# Patient Record
Sex: Female | Born: 2000 | Race: White | Hispanic: No | Marital: Single | Smoking: Never smoker
Health system: Southern US, Community
[De-identification: ages and names within clinical notes are randomized; demographics above are authoritative.]

## PROBLEM LIST (undated history)

## (undated) DIAGNOSIS — D649 Anemia, unspecified: Secondary | ICD-10-CM

## (undated) DIAGNOSIS — F419 Anxiety disorder, unspecified: Secondary | ICD-10-CM

## (undated) DIAGNOSIS — F32A Depression, unspecified: Secondary | ICD-10-CM

---
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
## 2021-10-20 IMAGING — CT CT ABD-PELV W/ CM
2 of 4 series · 14 of 46 positions shown, 16 images · IV contrast (APPLIED)
Comparison: Chest radiographs 06/30/2020.

CLINICAL DATA: 19-year-old female with chest pain, abdominal pain,
syncope, nonproductive cough.

EXAM:
CT ANGIOGRAPHY CHEST
CT ABDOMEN AND PELVIS WITH CONTRAST
TECHNIQUE: Multidetector CT imaging of the chest was performed using the
standard protocol during bolus administration of intravenous
contrast. Multiplanar CT image reconstructions and MIPs were
obtained to evaluate the vascular anatomy. Multidetector CT imaging
of the abdomen and pelvis was performed using the standard protocol
during bolus administration of intravenous contrast.
CONTRAST:  100mL OMNIPAQUE IOHEXOL 350 MG/ML SOLN

[Series 2: axial st · axial · 0.75mm/px · z∈[-1166,-721]mm · 11 of 99 slices shown, 13 images]
[im 5/99  soft-tissue]
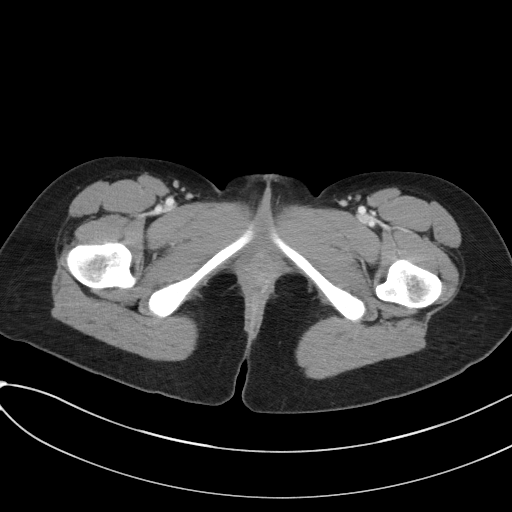
[im 5/99  bone]
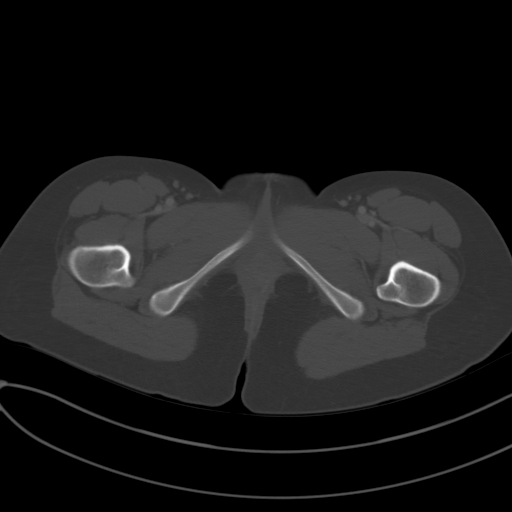
[im 14/99  soft-tissue]
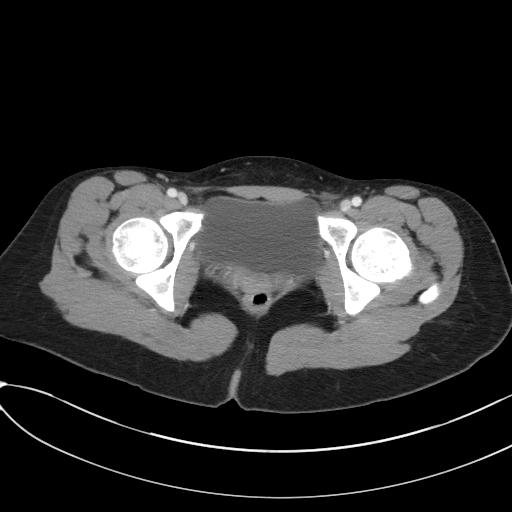
[im 23/99  soft-tissue]
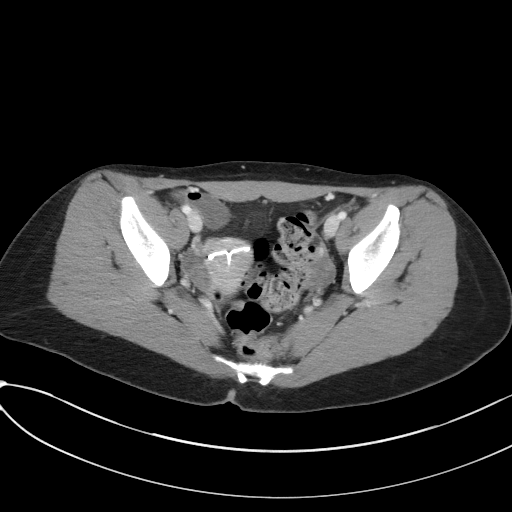
[im 32/99  soft-tissue]
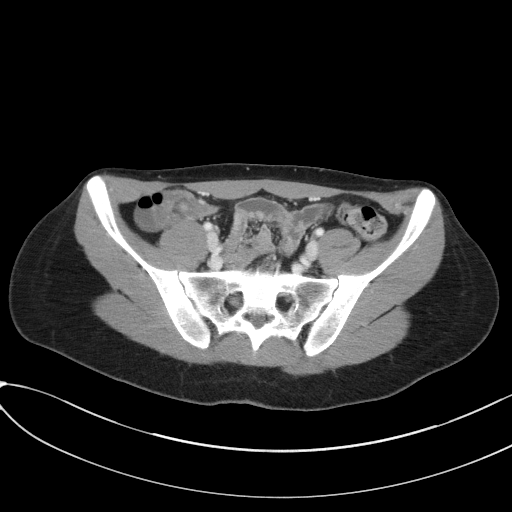
[im 41/99  soft-tissue]
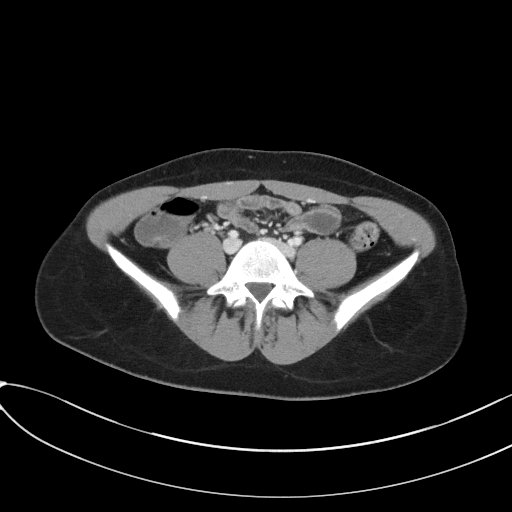
[im 49/99  soft-tissue]
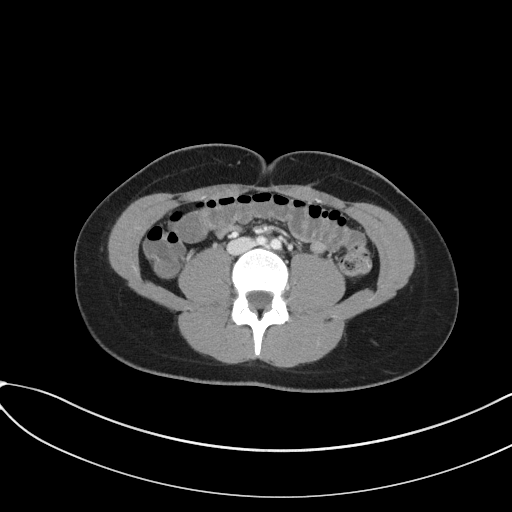
[im 58/99  soft-tissue]
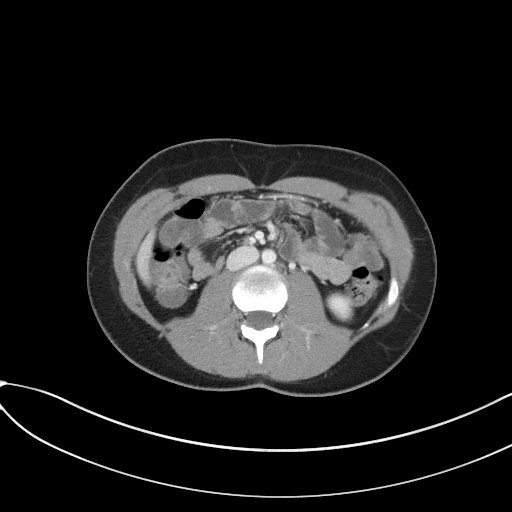
[im 67/99  soft-tissue]
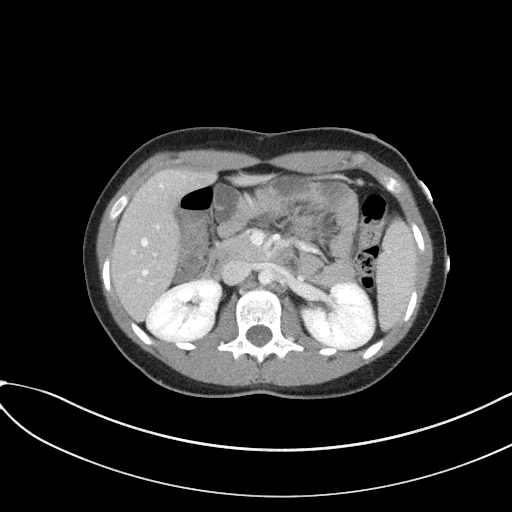
[im 76/99  soft-tissue]
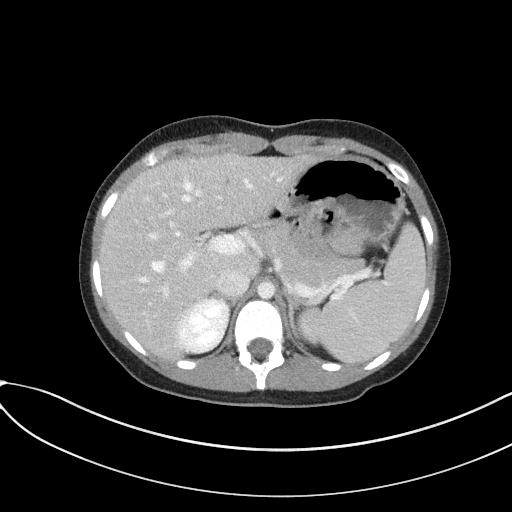
[im 76/99  bone]
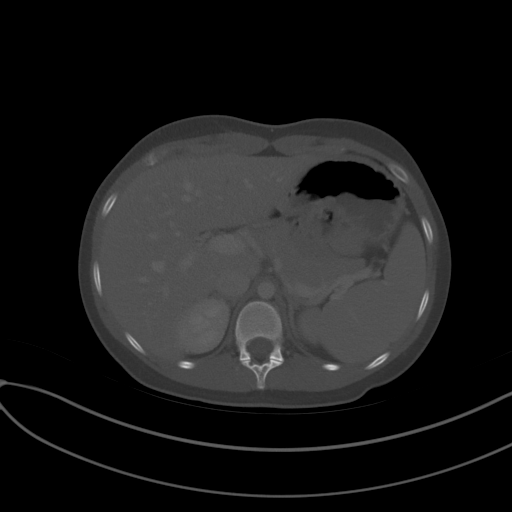
[im 85/99  soft-tissue]
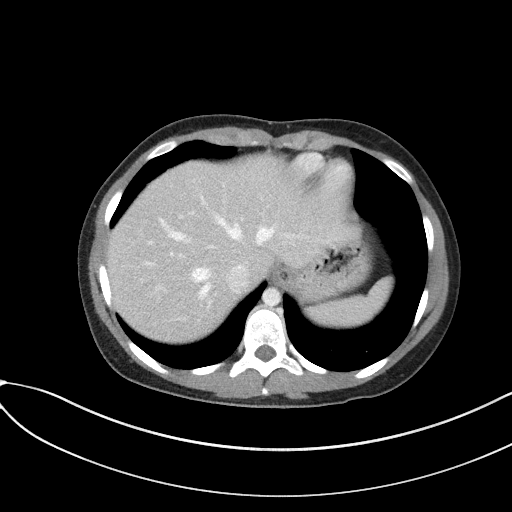
[im 94/99  soft-tissue]
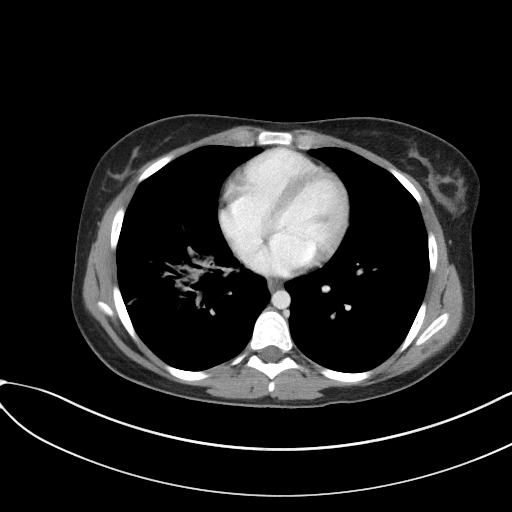

[Series 5: coronal st · coronal · 0.60mm/px · 3 of 76 slices shown]
[im 26/76  soft-tissue]
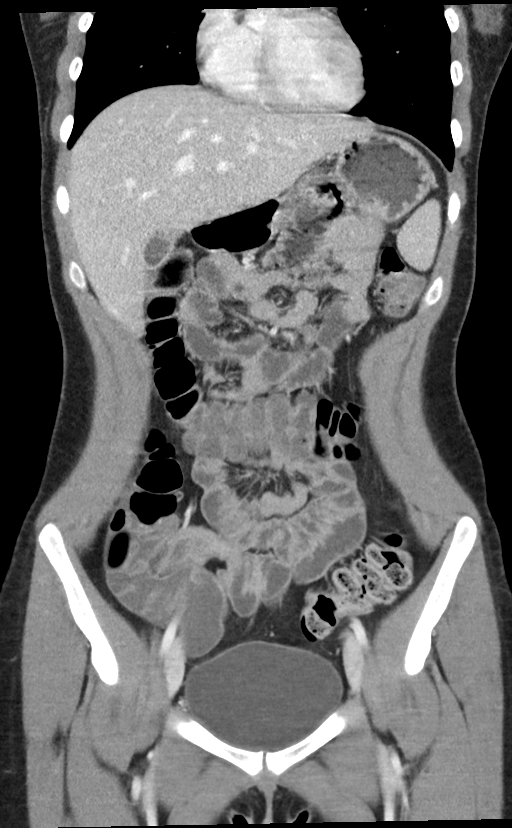
[im 34/76  soft-tissue]
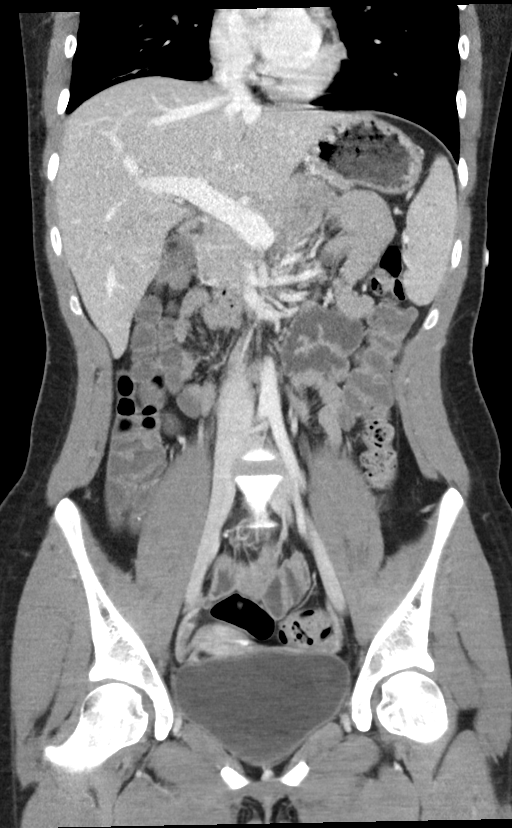
[im 42/76  soft-tissue]
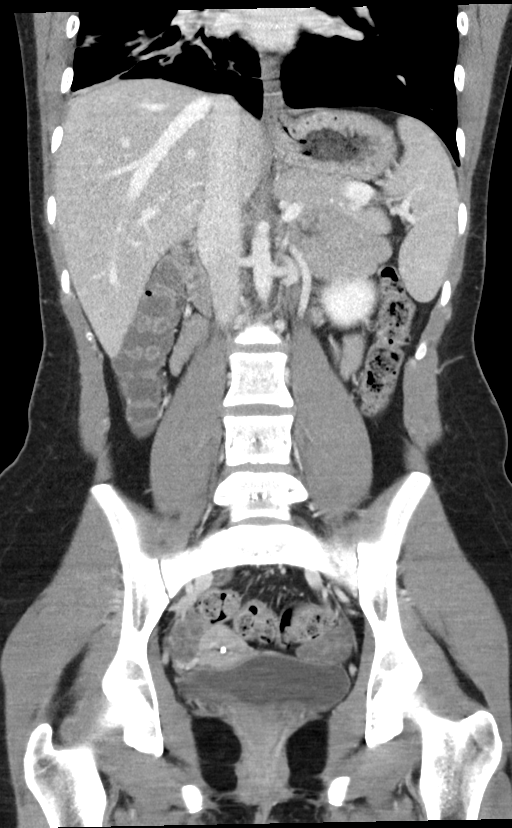

[14 of 46 positions shown; findings below may reference images not displayed]

FINDINGS: CTA CHEST FINDINGS

Cardiovascular: Good contrast bolus timing in the pulmonary arterial
tree.

No focal filling defect identified in the pulmonary arteries to
suggest acute pulmonary embolism.

No cardiomegaly. No pericardial effusion.  Negative thoracic aorta.

Mediastinum/Nodes: Small volume residual thymus. Asymmetrically
enlarged right hilar lymph nodes up to 11 mm short axis appear
reactive. No mediastinal lymphadenopathy.

Lungs/Pleura: Major airways are patent. Left lung and right upper
lobe are clear.

There is confluent peribronchial opacity in the anterior basal
segment of the lower lobe most resembling peribronchial infection
(series 8, image 173). Small areas of consolidation in the affected
region, with peribronchial opacity continuing to the pleural
surface, costophrenic angle. No pleural effusion. Right middle lobe
relatively spared.

Musculoskeletal: Negative.

Review of the MIP images confirms the above findings.

CT ABDOMEN and PELVIS FINDINGS

Hepatobiliary: Negative liver and gallbladder.

Pancreas: Negative.

Spleen: Negative.

Adrenals/Urinary Tract: Normal adrenal glands. Kidneys enhance
symmetrically and appear normal. No hydroureter. Unremarkable
urinary bladder.

Stomach/Bowel: There is fluid in the large bowel to the splenic
flexure. Retained stool distally. No dilated or inflamed large
bowel. Appendix is fluid-filled but otherwise appears normal on
coronal image 34. Decompressed terminal ileum. Fluid-filled but
nondilated small bowel in the abdomen and pelvis. Decompressed
duodenum. Negative stomach. No free air, free fluid, or mesenteric
stranding.

Vascular/Lymphatic: Major vascular structures in the abdomen and
pelvis appear patent including the portal venous system. No
lymphadenopathy.

Reproductive: Negative; IUD in place.

Other: No pelvic free fluid.

Musculoskeletal: Probable benign bone island medial right iliac bone
on series 2, image 68. Otherwise negative.

Review of the MIP images confirms the above findings.
IMPRESSION: 1. Negative for acute pulmonary embolus.
2. Positive for anterior basal segment right lower lobe
Bronchopneumonia. No pleural effusion. Reactive right hilar lymph
nodes.
3. Fluid in nondilated small and large bowel suggestive of
enteritis/diarrhea. No other acute or inflammatory finding in the
abdomen or pelvis.

## 2022-01-15 ENCOUNTER — Encounter: Payer: Self-pay | Admitting: Adult Health

## 2022-01-15 ENCOUNTER — Ambulatory Visit (INDEPENDENT_AMBULATORY_CARE_PROVIDER_SITE_OTHER): Payer: PRIVATE HEALTH INSURANCE | Admitting: Adult Health

## 2022-01-15 VITALS — BP 102/64 | HR 97 | Temp 97.2°F | Ht 66.0 in | Wt 132.0 lb

## 2022-01-15 DIAGNOSIS — J039 Acute tonsillitis, unspecified: Secondary | ICD-10-CM | POA: Diagnosis not present

## 2022-01-15 MED ORDER — AMOXICILLIN-POT CLAVULANATE 875-125 MG PO TABS: 1.0000 | ORAL_TABLET | Freq: Two times a day (BID) | ORAL | 0 refills | Status: DC

## 2022-01-15 NOTE — Progress Notes (Signed)
[facility] [address] [place], [place] [address] Phone: [phone] Fax: [phone]   Office Visit Note  Patient Name: Nancy Taylor  Date of [id number]  Med Rec number [id number]  Date of Service: 01/15/2022  Patient has no known allergies.  Chief Complaint  Patient presents with   Sore Throat     Sore Throat  Associated symptoms include ear pain. Pertinent negatives include no coughing, diarrhea, ear discharge or vomiting.    Patient reports 2 days ago she noticed it was painful to swallow.  She had a cough previously, but it has resolved. She has some mild ear pain with swallowing. Denies headache, fever, chills or sweats. She has taken mucinex with no real relief. Denies any sick contacts at this time.    Current Medication:  Outpatient Encounter Medications as of 01/15/2022  Medication Sig   amoxicillin-clavulanate (AUGMENTIN) 875-125 MG tablet Take 1 tablet by mouth 2 (two) times daily.   busPIRone (BUSPAR) 10 MG tablet Take 10 mg by mouth 2 (two) times daily.   SPIRONOLACTONE PO Take 50 mg by mouth daily.   albuterol (VENTOLIN HFA) 108 (90 Base) MCG/ACT inhaler Inhale 2 puffs into the lungs every 4 (four) hours as needed for wheezing or shortness of breath. (Patient not taking: Reported on 01/15/2022)   azithromycin (ZITHROMAX Z-PAK) 250 MG tablet Take 250 mg daily starting tomorrow, 3/22 (first dose given in ER). (Patient not taking: Reported on 01/15/2022)   chlorpheniramine-HYDROcodone (TUSSIONEX PENNKINETIC ER) 10-8 MG/5ML SUER Take 5 mLs by mouth 2 (two) times daily. (Patient not taking: Reported on 01/15/2022)   ondansetron (ZOFRAN ODT) 4 MG disintegrating tablet Take 1 tablet (4 mg total) by mouth every 8 (eight) hours as needed for nausea or vomiting. (Patient not taking: Reported on 01/15/2022)   No facility-administered encounter medications on file as of 01/15/2022.      Medical History: Past Medical History:  Diagnosis Date   Anemia     Anxiety    Depression      Vital Signs: BP 102/64   Pulse 97   Temp (!) 97.2 F (36.2 C) (Tympanic)   Ht 5\' 6"  (1.676 m)   Wt 132 lb (59.9 kg)   SpO2 99%   BMI 21.31 kg/m    Review of Systems  Constitutional:  Negative for chills, fatigue and fever.  HENT:  Positive for ear pain and sore throat. Negative for ear discharge, sinus pressure and sinus pain.   Eyes:  Negative for pain and itching.  Respiratory:  Negative for cough.   Cardiovascular:  Negative for chest pain.  Gastrointestinal:  Negative for diarrhea, nausea and vomiting.    Physical Exam Vitals and nursing note reviewed.  Constitutional:      Appearance: Normal appearance.  HENT:     Right Ear: Tympanic membrane and ear canal normal.     Left Ear: Tympanic membrane and ear canal normal.     Nose: Nose normal.     Mouth/Throat:     Mouth: Mucous membranes are moist.     Pharynx: Posterior oropharyngeal erythema present.     Tonsils: Tonsillar exudate present. No tonsillar abscesses. 1+ on the right. 1+ on the left.  Cardiovascular:     Rate and Rhythm: Normal rate.  Pulmonary:     Effort: No respiratory distress.     Breath sounds: No rales.  Lymphadenopathy:     Cervical: Cervical adenopathy present.  Neurological:     Mental Status: She is alert.  Assessment/Plan: 1. Tonsillitis Finish all antibiotics as prescribed, even when you are feeling better.  TAke antibiotic with food.  Take an over-the-counter pain reliever/anti-inflammatory (such as ibuprofen) to help relieve pain or fever.  Rest and drink plenty of water.  Drinking warm or cold liquids (such as tea, soup or smoothies) and eating soft foods (such as oatmeal) may be more comfortable until your throat pain improves.  Do not share cups/water bottles/ utensils with others.  Wash your hands or use hand sanitizer often, especially before/after eating and after coughing into your hand or blowing your nose.  Send secure message to provider or  schedule return appointment as needed for new/worsening symptoms (such as increased throat pain) or if your symptoms are not improving after 2-3 days on the antibiotic.    - amoxicillin-clavulanate (AUGMENTIN) 875-125 MG tablet; Take 1 tablet by mouth 2 (two) times daily.  Dispense: 20 tablet; Refill: 0     General Counseling: Nancy Taylor verbalizes understanding of the findings of todays visit and agrees with plan of treatment. I have discussed any further diagnostic evaluation that may be needed or ordered today. We also reviewed her medications today. she has been encouraged to call the office with any questions or concerns that should arise related to todays visit.   No orders of the defined types were placed in this encounter.   Meds ordered this encounter  Medications   amoxicillin-clavulanate (AUGMENTIN) 875-125 MG tablet    Sig: Take 1 tablet by mouth 2 (two) times daily.    Dispense:  20 tablet    Refill:  0    Time spent:20 Minutes Time spent includes review of chart, medications, test results, and follow up plan with the patient.    Kendell Bane AGNP-C Nurse Practitioner

## 2022-02-03 ENCOUNTER — Encounter: Payer: Self-pay | Admitting: Medical

## 2022-02-03 ENCOUNTER — Ambulatory Visit (INDEPENDENT_AMBULATORY_CARE_PROVIDER_SITE_OTHER): Payer: PRIVATE HEALTH INSURANCE | Admitting: Medical

## 2022-02-03 ENCOUNTER — Other Ambulatory Visit: Payer: Self-pay

## 2022-02-03 VITALS — BP 90/64 | HR 55 | Temp 96.4°F | Wt 132.0 lb

## 2022-02-03 DIAGNOSIS — J029 Acute pharyngitis, unspecified: Secondary | ICD-10-CM | POA: Diagnosis not present

## 2022-02-03 NOTE — Progress Notes (Signed)
[facility] [address] [place], [place] [address] Phone: [phone] Fax: [phone]   Office Visit Note  Patient Name: Nancy Taylor  Date of [id number]  Med Rec number [id number]  Date of Service: 02/03/2022  Patient has no known allergies.  Chief Complaint  Patient presents with   Follow-up     HPI 21 YO female presents with throat issues.  Seen on 01/15/22 with tonsillitis, took 10 day course of Augmentin. Sx did improve but not completely resolved. Still has mild sore throat at times, mostly at night. Still needs to clear throat sometimes. No fever or chills, energy normal. No nasal congestion or cough. No hx of mono. Feels that lymph nodes are still swollen, no longer painful.  Some hx of seasonal allergies, usually in spring.    Current Medication:  Outpatient Encounter Medications as of 02/03/2022  Medication Sig   busPIRone (BUSPAR) 10 MG tablet Take 10 mg by mouth 2 (two) times daily.   SPIRONOLACTONE PO Take 50 mg by mouth daily.   [DISCONTINUED] albuterol (VENTOLIN HFA) 108 (90 Base) MCG/ACT inhaler Inhale 2 puffs into the lungs every 4 (four) hours as needed for wheezing or shortness of breath. (Patient not taking: Reported on 01/15/2022)   [DISCONTINUED] amoxicillin-clavulanate (AUGMENTIN) 875-125 MG tablet Take 1 tablet by mouth 2 (two) times daily. (Patient not taking: Reported on 02/03/2022)   [DISCONTINUED] azithromycin (ZITHROMAX Z-PAK) 250 MG tablet Take 250 mg daily starting tomorrow, 3/22 (first dose given in ER). (Patient not taking: Reported on 01/15/2022)   [DISCONTINUED] chlorpheniramine-HYDROcodone (TUSSIONEX PENNKINETIC ER) 10-8 MG/5ML SUER Take 5 mLs by mouth 2 (two) times daily. (Patient not taking: Reported on 01/15/2022)   [DISCONTINUED] ondansetron (ZOFRAN ODT) 4 MG disintegrating tablet Take 1 tablet (4 mg total) by mouth every 8 (eight) hours as needed for nausea or vomiting. (Patient not taking: Reported on 01/15/2022)   No  facility-administered encounter medications on file as of 02/03/2022.      Medical History: Past Medical History:  Diagnosis Date   Anemia    Anxiety    Depression      Vital Signs: BP 90/64   Pulse (!) 55   Temp (!) 96.4 F (35.8 C) (Tympanic)   Wt 132 lb (59.9 kg)   SpO2 99%   BMI 21.31 kg/m    Review of Systems  Constitutional:  Negative for chills, fatigue and fever.  HENT:  Positive for sore throat. Negative for congestion and trouble swallowing.   Respiratory:  Negative for cough.   Hematological:  Positive for adenopathy.    Physical Exam Vitals reviewed.  Constitutional:      General: She is not in acute distress.    Appearance: She is not ill-appearing.  HENT:     Head: Normocephalic.     Comments: Small to moderate soft cerumen to EACs bilaterally which partially obscures TMs. Visible portion of TMs normal.    Right Ear: External ear normal.     Left Ear: External ear normal.     Nose: Mucosal edema present. No congestion or rhinorrhea.     Right Turbinates: Swollen (mild).     Left Turbinates: Swollen (mild).     Mouth/Throat:     Mouth: Mucous membranes are moist. No oral lesions.     Pharynx: No pharyngeal swelling or posterior oropharyngeal erythema.     Tonsils: No tonsillar exudate. 1+ on the right. 1+ on the left.  Cardiovascular:     Rate and Rhythm: Regular rhythm. Bradycardia present.  Heart sounds: No murmur heard.    No friction rub. No gallop.  Pulmonary:     Effort: Pulmonary effort is normal.     Breath sounds: Normal breath sounds. No wheezing, rhonchi or rales.  Musculoskeletal:     Cervical back: Neck supple. No rigidity.  Lymphadenopathy:     Cervical: Cervical adenopathy (1+ anterior nodes, nontender) present.  Neurological:     Mental Status: She is alert.       Assessment/Plan: 1. Pharyngitis, unspecified etiology Suspect throat sx may be due to postnasal drainage, post-infectious or allergy related. Advised trial  of nasal steroid spray and Ibuprofen as needed. Will check strep culture to evaluate for possible persistent strep infection. Do not have high suspicion for mono based on clinical presentation.  - Culture, Group A Strep; Will send result to patient via MyChart when available.     General Counseling: Nancy Taylor verbalizes understanding of the findings of todays visit and agrees with plan of treatment. she has been encouraged to call the office with any questions or concerns that should arise related to todays visit.   No orders of the defined types were placed in this encounter.   No orders of the defined types were placed in this encounter.   Time spent:15 [place] PA-C [place] 02/03/2022 1:33 PM

## 2022-02-06 ENCOUNTER — Encounter: Payer: Self-pay | Admitting: Medical

## 2022-02-06 LAB — CULTURE, GROUP A STREP: Strep A Culture: NEGATIVE

## 2022-02-06 NOTE — Patient Instructions (Signed)
-  Try Flonase/Fluticasone nasal spray, 2 sprays to each nostril once a day for next 1-2 weeks to help relieve any post-nasal drainage. -Take Ibuprofen/Advil if needed for swollen lymph nodes or sore throat. -Rest and stay well hydrated (by drinking water and other liquids). Avoid/limit caffeine. -For your sore throat, use cough drops/throat lozenges, gargle warm salt water and/or drink warm liquids (like tea with honey).  -You will receive a MyChart message notifying you of your lab results when they are available.  -Send MyChart message to provider in meantime as needed for new/worsening symptoms.

## 2022-04-22 ENCOUNTER — Other Ambulatory Visit: Payer: Self-pay

## 2022-04-22 ENCOUNTER — Ambulatory Visit (INDEPENDENT_AMBULATORY_CARE_PROVIDER_SITE_OTHER): Payer: PRIVATE HEALTH INSURANCE | Admitting: Medical

## 2022-04-22 ENCOUNTER — Encounter: Payer: Self-pay | Admitting: Medical

## 2022-04-22 VITALS — BP 96/61 | HR 78 | Temp 96.0°F | Wt 131.0 lb

## 2022-04-22 DIAGNOSIS — Z8619 Personal history of other infectious and parasitic diseases: Secondary | ICD-10-CM

## 2022-04-22 DIAGNOSIS — R319 Hematuria, unspecified: Secondary | ICD-10-CM | POA: Diagnosis not present

## 2022-04-22 DIAGNOSIS — N390 Urinary tract infection, site not specified: Secondary | ICD-10-CM | POA: Diagnosis not present

## 2022-04-22 LAB — POCT URINALYSIS DIPSTICK
Bilirubin, UA: NEGATIVE
Glucose, UA: NEGATIVE
Ketones, UA: NEGATIVE
Nitrite, UA: POSITIVE
Protein, UA: POSITIVE — AB
Spec Grav, UA: 1.02 (ref 1.010–1.025)
Urobilinogen, UA: NEGATIVE E.U./dL — AB
pH, UA: 5 (ref 5.0–8.0)

## 2022-04-22 MED ORDER — FLUCONAZOLE 150 MG PO TABS: 150.0000 mg | ORAL_TABLET | Freq: Once | ORAL | 0 refills | Status: AC

## 2022-04-22 MED ORDER — NITROFURANTOIN MONOHYD MACRO 100 MG PO CAPS: 100.0000 mg | ORAL_CAPSULE | Freq: Two times a day (BID) | ORAL | 0 refills | Status: DC

## 2022-04-22 NOTE — Patient Instructions (Addendum)
-  Take Nitrofurantoin every 12 hours (twice a day) with food for 7 days; Finish all antibiotics. -Drink plenty of water. Avoid or limit alcohol and caffeine, which may make symptoms worse. -You may take an over-the-counter pain reliever (i.e., AZO urinary pain relief, Tylenol) as needed for pain next 1-2 days.  -You will receive a MyChart message notifying you of your lab results when they are available.  -Send MyChart message to provider or schedule return appointment in meantime as needed for new/worsening symptoms (such as fever or abdominal pain) if your symptoms are not improving after 2-3 days taking antibiotics. -You may take the Fluconazole (yeast pill) if needed for yeast infection symptoms (i.e. vaginal itching or thick white vaginal discharge) after finishing the antibiotic. -Consider scheduling a return appointment in 1-2 weeks for STI screening.

## 2022-04-22 NOTE — Progress Notes (Signed)
[address] [place], [place] [address] Phone: [phone] Fax: [phone]   Office Visit Note  Patient Name: Nancy Taylor  Date of [id number]  Med Rec number [id number]  Date of Service: 04/22/2022  Patient has no known allergies.  Chief Complaint  Patient presents with   uti    HPI 22 YO female presents with suspected UTI.  Has had a few UTIs before, feels somewhat similarly.  Initially noted stinging pain with urination about 4 days ago. Began taking AZO, stinging resolved. This morning, noted urgency to urinate persisted after she urinated. Some increased frequency. Some lower abdominal discomfort but also started menses today. No back pain, nausea, vomiting, fever or chills. Little vaginal itching/discomfort perhaps along with stinging. Did have vaginal intercourse with new partner shortly before sx began, urinated afterwards and used condom.    Current Medication:  Outpatient Encounter Medications as of 04/22/2022  Medication Sig   spironolactone (ALDACTONE) 50 MG tablet Take by mouth.   busPIRone (BUSPAR) 10 MG tablet Take 10 mg by mouth 2 (two) times daily.   [DISCONTINUED] SPIRONOLACTONE PO Take 50 mg by mouth daily.   No facility-administered encounter medications on file as of 04/22/2022.      Medical History: Past Medical History:  Diagnosis Date   Anemia    Anxiety    Depression      Vital Signs: BP 96/61   Pulse 78   Temp (!) 96 F (35.6 C) (Tympanic)   Wt 131 lb (59.4 kg)   SpO2 96%   BMI 21.14 kg/m    Review of Systems  Constitutional:  Negative for chills and fever.  Gastrointestinal:  Negative for nausea and vomiting.  Genitourinary:  Positive for dysuria, frequency, pelvic pain (mild), urgency and vaginal pain (itching/discomfort). Negative for flank pain, genital sores and vaginal discharge.    Physical Exam Vitals reviewed.  Constitutional:      General: She is not in acute distress.    Appearance:  She is not ill-appearing.  Cardiovascular:     Rate and Rhythm: Normal rate and regular rhythm.     Heart sounds: No murmur heard.    No friction rub. No gallop.  Pulmonary:     Effort: Pulmonary effort is normal.     Breath sounds: Normal breath sounds. No wheezing, rhonchi or rales.  Neurological:     Mental Status: She is alert.    Results for orders placed or performed in visit on 04/22/22 (from the past 24 hour(s))  POCT Urinalysis Dipstick     Status: Abnormal   Collection Time: 04/22/22 10:52 AM  Result Value Ref Range   Color, UA brown    Clarity, UA     Glucose, UA Negative Negative   Bilirubin, UA negative    Ketones, UA negative    Spec Grav, UA 1.020 1.010 - 1.025   Blood, UA +++    pH, UA 5.0 5.0 - 8.0   Protein, UA Positive (A) Negative   Urobilinogen, UA negative (A) 0.2 or 1.0 E.U./dL   Nitrite, UA positive    Leukocytes, UA Moderate (2+) (A) Negative   Appearance cloudy    Odor        Assessment/Plan: 1. Urinary tract infection with hematuria, site unspecified History and urinalysis findings consistent with UTI. Will start empiric antibiotic and send urine for culture.   - POCT Urinalysis Dipstick - Urine Culture - nitrofurantoin, macrocrystal-monohydrate, (MACROBID) 100 MG capsule; Take 1 capsule (100 mg total) by  mouth 2 (two) times daily. X 7 days  Dispense: 14 capsule; Refill: 0  2. H/O candidiasis Sent prescription for Fluconazole if needed after for yeast infection after antibiotic. - fluconazole (DIFLUCAN) 150 MG tablet; Take 1 tablet (150 mg total) by mouth once for 1 dose. If needed for yeast infection after antibiotic.  Dispense: 1 tablet; Refill: 0  Patient Instructions:  -Take Nitrofurantoin every 12 hours (twice a day) with food for 7 days; Finish all antibiotics. -Drink plenty of water. Avoid or limit alcohol and caffeine, which may make symptoms worse. -You may take an over-the-counter pain reliever (i.e., AZO urinary pain relief,  Tylenol) as needed for pain next 1-2 days.  -You will receive a MyChart message notifying you of your lab results when they are available.  -Send MyChart message to provider or schedule return appointment in meantime as needed for new/worsening symptoms (such as fever or abdominal pain) if your symptoms are not improving after 2-3 days taking antibiotics. -You may take the Fluconazole (yeast pill) if needed for yeast infection symptoms (i.e. vaginal itching or thick white vaginal discharge) after finishing the antibiotic. -Consider scheduling a return appointment in 1-2 weeks for STI testing.    General Counseling: Helyne verbalizes understanding of the findings of todays visit and agrees with plan of treatment.  she has been encouraged to call the office with any questions or concerns that should arise related to todays visit.   No orders of the defined types were placed in this encounter.   No orders of the defined types were placed in this encounter.   Time spent: [place] PA-C [place] 04/22/2022 10:33 AM

## 2022-04-23 LAB — URINE CULTURE

## 2022-05-08 ENCOUNTER — Other Ambulatory Visit: Payer: Self-pay

## 2022-05-08 ENCOUNTER — Ambulatory Visit (INDEPENDENT_AMBULATORY_CARE_PROVIDER_SITE_OTHER): Payer: PRIVATE HEALTH INSURANCE | Admitting: Oncology

## 2022-05-08 VITALS — HR 69 | Temp 96.2°F | Wt 125.0 lb

## 2022-05-08 DIAGNOSIS — R051 Acute cough: Secondary | ICD-10-CM

## 2022-05-08 DIAGNOSIS — J029 Acute pharyngitis, unspecified: Secondary | ICD-10-CM

## 2022-05-08 LAB — POC SOFIA 2 FLU + SARS ANTIGEN FIA
Influenza A, POC: NEGATIVE
Influenza B, POC: NEGATIVE
SARS Coronavirus 2 Ag: NEGATIVE

## 2022-05-08 MED ORDER — AZITHROMYCIN 250 MG PO TABS: ORAL_TABLET | ORAL | 0 refills | Status: AC

## 2022-05-08 NOTE — Progress Notes (Signed)
[address] [place], [place] [address] Phone: [phone] Fax: [phone]   Office Visit Note  Patient Name: Nancy Taylor  Date of [id number]  Med Rec number [id number]  Date of Service: 05/08/2022  Patient has no known allergies.  No chief complaint on file.  Patient is an 22 y.o. student here for complaints of sore throat, chest congestion and sob that started on Sunday. Thought she may have had a fever. Still having a sore throat and cough. Has been taking cold and flu meds daily. Several sick contacts. One of her close friends has the flu and tested pos Sunday.   No recent antibitoics. No hx of asthma but uses inhaler for cough and sob intermittently. Had pneumonia 2 years ago and states she had some scarring.  Current Medication:  Outpatient Encounter Medications as of 05/08/2022  Medication Sig   busPIRone (BUSPAR) 10 MG tablet Take 10 mg by mouth 2 (two) times daily.   nitrofurantoin, macrocrystal-monohydrate, (MACROBID) 100 MG capsule Take 1 capsule (100 mg total) by mouth 2 (two) times daily. X 7 days   spironolactone (ALDACTONE) 50 MG tablet Take by mouth.   No facility-administered encounter medications on file as of 05/08/2022.      Medical History: Past Medical History:  Diagnosis Date   Anemia    Anxiety    Depression      Vital Signs: There were no vitals taken for this visit.  ROS: As per HPI.  All other pertinent ROS negative.     Review of Systems  Constitutional:  Positive for fatigue and fever.  HENT:  Positive for congestion, ear pain and sore throat. Negative for sinus pressure and sinus pain.   Respiratory:  Positive for cough and shortness of breath.   Gastrointestinal:  Positive for diarrhea and nausea (improving). Negative for vomiting.  Neurological:  Positive for headaches. Negative for dizziness.    Physical Exam Constitutional:      Appearance: Normal appearance.  HENT:     Right Ear: Tympanic membrane  normal.     Left Ear: Tympanic membrane normal.     Nose: Congestion and rhinorrhea present. Rhinorrhea is clear.     Right Turbinates: Swollen.     Left Turbinates: Swollen.     Right Sinus: No maxillary sinus tenderness or frontal sinus tenderness.     Left Sinus: No maxillary sinus tenderness or frontal sinus tenderness.  Cardiovascular:     Rate and Rhythm: Normal rate.  Pulmonary:     Effort: Pulmonary effort is normal.     Breath sounds: Normal breath sounds.  Neurological:     Mental Status: She is alert.     No results found for this or any previous visit (from the past 24 hour(s)).  Assessment/Plan: 1. Acute cough - Flu and Covid negative.  -Onset of symptoms greater than 7 days.  Recommend treatment with azithromycin.  Discussed how and when to take.  Discussed side effects.  Sent to pharmacy for her to pick up.  Please let me know if her symptoms do not improve or worsen over the next 5 to 7 days.  Stay hydrated.  Use over-the-counter medication such as DayQuil/NyQuil for congestion and cough.  - POC SOFIA 2 FLU + SARS ANTIGEN FIA - azithromycin (ZITHROMAX) 250 MG tablet; Take 2 tablets on day 1, then 1 tablet daily on days 2 through 5  Dispense: 6 tablet; Refill: 0  2. Sore throat - Exam not concerning for strep.  Likely secondary to postnasal drip. -Recommend Flonase 2 sprays each nostril daily. -Salt water gargles and ibuprofen for sore throat.  Discussed continuing Mucinex/DayQuil/NyQuil as needed for her symptoms.  - POC SOFIA 2 FLU + SARS ANTIGEN FIA   Disposition-RTC as needed.  General Counseling: [name] verbalizes understanding of the findings of todays visit and agrees with plan of treatment. I have discussed any further diagnostic evaluation that may be needed or ordered today. We also reviewed her medications today. she has been encouraged to call the office with any questions or concerns that should arise related to todays visit.   No orders of the  defined types were placed in this encounter.   No orders of the defined types were placed in this encounter.   I spent 20 minutes dedicated to the care of this patient (face-to-face and non-face-to-face) on the date of the encounter to include what is described in the assessment and plan.   Faythe Casa, NP 05/08/2022 10:22 AM

## 2023-07-22 ENCOUNTER — Encounter: Payer: Self-pay | Admitting: Medical

## 2023-07-22 ENCOUNTER — Other Ambulatory Visit: Payer: Self-pay

## 2023-07-22 ENCOUNTER — Ambulatory Visit (INDEPENDENT_AMBULATORY_CARE_PROVIDER_SITE_OTHER): Admitting: Medical

## 2023-07-22 VITALS — BP 100/63 | HR 59 | Temp 98.0°F

## 2023-07-22 DIAGNOSIS — J069 Acute upper respiratory infection, unspecified: Secondary | ICD-10-CM | POA: Diagnosis not present

## 2023-07-22 LAB — POC COVID19/FLU A&B COMBO
Covid Antigen, POC: NEGATIVE
Influenza A Antigen, POC: NEGATIVE
Influenza B Antigen, POC: NEGATIVE

## 2023-07-22 NOTE — Progress Notes (Signed)
 [facility] [address] [place], [place] [address] Phone: [phone] Fax: [phone]   Office Visit Note  Patient Name: Nancy Taylor  Date of VHQIO:[id number]  Med Rec number [id number]  Date of Service: 07/22/2023  Allergies: Patient has no known allergies.  Chief Complaint  Patient presents with   Acute Visit     HPI 23 y.o. college student presents with nasal congestion, cough.  Sx began 4 days ago. Had sore throat first day or two, better now. No fever or chills. Has been fatigued. Had HA few days ago. No myalgias, nausea, vomiting, diarrhea. Clear and yellow nasal d/c. Expectorating some yellow mucus.  Occasionally has spring allergy sx.  Has taken a Mucinex Cold and Flu med.  No known exposures. Had flu shot.    Current Medication:  Outpatient Encounter Medications as of 07/22/2023  Medication Sig   INCASSIA 0.35 MG tablet Take 1 tablet by mouth daily.   lisdexamfetamine (VYVANSE) 30 MG capsule Take 30 mg by mouth every morning.   spironolactone (ALDACTONE) 50 MG tablet Take by mouth.   [DISCONTINUED] busPIRone (BUSPAR) 10 MG tablet Take 10 mg by mouth 2 (two) times daily. (Patient not taking: Reported on 07/22/2023)   [DISCONTINUED] Pseudoephedrine-APAP-DM (DAYQUIL PO) Take by mouth. (Patient not taking: Reported on 07/22/2023)   No facility-administered encounter medications on file as of 07/22/2023.      Medical History: Past Medical History:  Diagnosis Date   Anemia    Anxiety    Depression      Vital Signs: BP 100/63   Pulse (!) 59   Temp 98 F (36.7 C) (Tympanic)   SpO2 99%    Review of Systems See HPI  Physical Exam Vitals reviewed.  Constitutional:      General: She is not in acute distress.    Comments: Tired appearing  HENT:     Head: Normocephalic.     Right Ear: Ear canal and external ear normal.     Left Ear: External ear normal.     Ears:     Comments: Right TM dull. Moderate cerumen to left EAC, unable to view  left TM.     Nose: Mucosal edema, congestion (L>R) and rhinorrhea (cloudy) present.     Right Sinus: No maxillary sinus tenderness or frontal sinus tenderness.     Left Sinus: No maxillary sinus tenderness or frontal sinus tenderness.     Mouth/Throat:     Mouth: Mucous membranes are moist. No oral lesions.     Pharynx: Posterior oropharyngeal erythema (slight) present. No pharyngeal swelling.     Tonsils: No tonsillar exudate. 0 on the right. 0 on the left.  Cardiovascular:     Rate and Rhythm: Normal rate and regular rhythm.     Heart sounds: No murmur heard.    No friction rub. No gallop.  Pulmonary:     Effort: Pulmonary effort is normal.     Breath sounds: Normal breath sounds. No wheezing, rhonchi or rales.  Musculoskeletal:     Cervical back: Neck supple. No rigidity.  Lymphadenopathy:     Cervical: Cervical adenopathy (1+ anterior nodes, nontender) present.  Neurological:     Mental Status: She is alert.     Results for orders placed or performed in visit on 07/22/23 (from the past 24 hours)  POC Covid19/Flu A&B Antigen     Status: Normal   Collection Time: 07/22/23  1:58 PM  Result Value Ref Range   Influenza A Antigen, POC Negative  Negative   Influenza B Antigen, POC Negative Negative   Covid Antigen, POC Negative Negative     Assessment/Plan: 1. Acute upper respiratory infection (Primary)  - POC Covid19/Flu A&B Antigen   Suspect viral infection. Discussed supportive measures and OTC symptomatic treatment.  Had CMA perform brief ear lavage to help remove cerumen to left EAC. Patient tolerated procedure well.  Patient Instructions  -Rest and stay well hydrated (by drinking water and other liquids). Avoid/limit caffeine. -Take over-the-counter medicines (i.e. Mucinex Cold and Flu med, Nyquil, Ibuprofen) to help relieve your symptoms. -For your sore throat/cough, use cough drops/throat lozenges, gargle warm salt water and/or drink warm liquids (like tea with  honey). -Send MyChart message to provider or schedule return visit as needed for new/worsening symptoms or if symptoms do not improve as discussed with recommended treatment.     General Counseling: [name] verbalizes understanding of the findings of todays visit and plan of treatment. she has been encouraged to call the office with any questions or concerns that should arise related to todays visit.    Time spent:20 Minutes    Ponciano Bristle PA-C [facility] 07/22/2023 1:38 PM

## 2023-07-23 NOTE — Patient Instructions (Signed)
-  Rest and stay well hydrated (by drinking water and other liquids). Avoid/limit caffeine. -Take over-the-counter medicines (i.e. Mucinex Cold and Flu med, Nyquil, Ibuprofen) to help relieve your symptoms. -For your sore throat/cough, use cough drops/throat lozenges, gargle warm salt water and/or drink warm liquids (like tea with honey). -Send MyChart message to provider or schedule return visit as needed for new/worsening symptoms or if symptoms do not improve as discussed with recommended treatment.
